# Patient Record
Sex: Male | Born: 1995 | Race: White | Hispanic: No | Marital: Single | Smoking: Never smoker
Health system: Southern US, Community
[De-identification: ages and names within clinical notes are randomized; demographics above are authoritative.]

## PROBLEM LIST (undated history)

## (undated) DIAGNOSIS — Z68.41 Body mass index (BMI) pediatric, 85th percentile to less than 95th percentile for age: Secondary | ICD-10-CM

## (undated) DIAGNOSIS — M9260 Juvenile osteochondrosis of tarsus, unspecified ankle: Secondary | ICD-10-CM

## (undated) DIAGNOSIS — M25561 Pain in right knee: Secondary | ICD-10-CM

## (undated) HISTORY — DX: Juvenile osteochondrosis of tarsus, unspecified ankle: M92.60

## (undated) HISTORY — DX: Body mass index (bmi) pediatric, 85th percentile to less than 95th percentile for age: Z68.53

## (undated) HISTORY — DX: Pain in right knee: M25.561

---
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
## 2020-04-30 DIAGNOSIS — S61212A Laceration without foreign body of right middle finger without damage to nail, initial encounter: Secondary | ICD-10-CM | POA: Diagnosis not present

## 2020-11-05 DIAGNOSIS — Z20822 Contact with and (suspected) exposure to covid-19: Secondary | ICD-10-CM | POA: Diagnosis not present

## 2020-11-05 DIAGNOSIS — U071 COVID-19: Secondary | ICD-10-CM | POA: Diagnosis not present

## 2020-11-06 DIAGNOSIS — Z1159 Encounter for screening for other viral diseases: Secondary | ICD-10-CM | POA: Diagnosis not present

## 2021-09-24 DIAGNOSIS — J4520 Mild intermittent asthma, uncomplicated: Secondary | ICD-10-CM | POA: Diagnosis not present

## 2021-09-24 DIAGNOSIS — Z23 Encounter for immunization: Secondary | ICD-10-CM | POA: Diagnosis not present
